# Patient Record
Sex: Female | Born: 1980 | Hispanic: No | Marital: Married | Smoking: Never smoker
Health system: Southern US, Community
[De-identification: ages and names within clinical notes are randomized; demographics above are authoritative.]

---

## 2005-11-24 ENCOUNTER — Ambulatory Visit: Payer: Self-pay | Admitting: Obstetrics & Gynecology

## 2006-02-12 ENCOUNTER — Inpatient Hospital Stay: Payer: Self-pay

## 2008-07-01 LAB — CONVERTED CEMR LAB: Pap Smear: NORMAL

## 2008-12-08 ENCOUNTER — Ambulatory Visit: Payer: Self-pay | Admitting: Family Medicine

## 2008-12-08 DIAGNOSIS — E785 Hyperlipidemia, unspecified: Secondary | ICD-10-CM

## 2008-12-08 DIAGNOSIS — R5381 Other malaise: Secondary | ICD-10-CM

## 2008-12-08 DIAGNOSIS — I781 Nevus, non-neoplastic: Secondary | ICD-10-CM

## 2008-12-08 DIAGNOSIS — D1800 Hemangioma unspecified site: Secondary | ICD-10-CM | POA: Insufficient documentation

## 2008-12-08 DIAGNOSIS — R5383 Other fatigue: Secondary | ICD-10-CM

## 2008-12-28 ENCOUNTER — Ambulatory Visit: Payer: Self-pay | Admitting: Family Medicine

## 2008-12-29 LAB — CONVERTED CEMR LAB
Albumin: 3.7 g/dL (ref 3.5–5.2)
Alkaline Phosphatase: 39 units/L (ref 39–117)
BUN: 14 mg/dL (ref 6–23)
CO2: 29 meq/L (ref 19–32)
Calcium: 9.1 mg/dL (ref 8.4–10.5)
Chloride: 106 meq/L (ref 96–112)
Cholesterol: 202 mg/dL — ABNORMAL HIGH (ref 0–200)
Eosinophils Relative: 2.2 % (ref 0.0–5.0)
GFR calc non Af Amer: 105.65 mL/min (ref >60)
Glucose, Bld: 88 mg/dL (ref 70–99)
HCT: 37 % (ref 36.0–46.0)
Hemoglobin: 12.6 g/dL (ref 12.0–15.0)
Lymphs Abs: 1.8 10*3/uL (ref 0.7–4.0)
Monocytes Relative: 6.4 % (ref 3.0–12.0)
Neutro Abs: 2.7 10*3/uL (ref 1.4–7.7)
Potassium: 3.9 meq/L (ref 3.5–5.1)
Total CHOL/HDL Ratio: 3
WBC: 4.9 10*3/uL (ref 4.5–10.5)

## 2009-01-08 ENCOUNTER — Ambulatory Visit: Payer: Self-pay | Admitting: Family Medicine

## 2009-07-02 ENCOUNTER — Encounter: Payer: Self-pay | Admitting: Family Medicine

## 2010-05-26 ENCOUNTER — Ambulatory Visit: Payer: Self-pay | Admitting: Family Medicine

## 2010-05-26 DIAGNOSIS — R519 Headache, unspecified: Secondary | ICD-10-CM | POA: Insufficient documentation

## 2010-05-26 DIAGNOSIS — R51 Headache: Secondary | ICD-10-CM

## 2010-05-26 DIAGNOSIS — M542 Cervicalgia: Secondary | ICD-10-CM

## 2010-10-25 NOTE — Assessment & Plan Note (Signed)
Summary: headache x 11 days/alc 11:30   Vital Signs:  Patient profile:   30 year old female Height:      66 inches Weight:      115.0 pounds BMI:     18.63 Temp:     99.0 degrees F oral Pulse rate:   72 / minute Pulse rhythm:   regular BP sitting:   100 / 60  (left arm) Cuff size:   regular  Vitals Entered By: Benny Lennert CMA Duncan Dull) (May 26, 2010 11:33 AM)  History of Present Illness: Chief complaint headache for 61 days  30 year old female:  dull back, raiating to the front.   has been to the chiropractor. on the twenty fifth. not completely gone.   having a lot of muscle spasm on the right.      Headache HPI:      The patient comes in for an acute visit for headaches.  The current headache started approximately 05/15/2010.  The headaches will last anywhere from 2 hours to 3 days at a time.  She has approximately 1 headaches per month.        The location of the headaches are occipital.  Aggravating factors include head movement and bending down.  Precipitating factors consist of stress.  The headaches are associated with photophobia.        Positive alarm features include change in frequency from prior H/A's and change in features from prior H/A's.  The patient denies first or worst H/A of life, mylagia, fever, malaise, weight loss, scalp tenderness, jaw claudication, focal neurologic symptoms, confusion, seizures, and impaired level of consciousness.         Headache Treatment History:      NSAID's were tried but not effective.  She has tried acetaminophen-plain and ibuprofen which were ineffective.    Allergies: 1)  ! Codeine Sulfate (Codeine Sulfate)  Past History:  Past medical, surgical, family and social histories (including risk factors) reviewed, and no changes noted (except as noted below).  Past Medical History: Reviewed history from 12/08/2008 and no changes required. Hyperlipidemia  DERM = Diona Browner OB = [place]  Past Surgical  History: Reviewed history from 12/08/2008 and no changes required. none  Family History: Reviewed history from 12/08/2008 and no changes required. M: meningioma, primary biliary cirrhosis PGF, gout, leukemia Aunt: Systemic Lupus MGM: CAD, COPD, RA Sister: JRA  Very strong autoimmune history  Social History: Reviewed history from 12/08/2008 and no changes required. Marital Status: Married Children: 52 (55 year old boy) Occupation: Airline pilot, [employer], [employer], [employer] Never Smoked Alcohol use-no Drug use-no  Review of Systems       REVIEW OF SYSTEMS  GEN: No systemic complaints, no fevers, chills, sweats, or other acute illnesses MSK: Detailed in the HPI GI: tolerating PO intake without difficulty Neuro: No numbness, parasthesias, or tingling associated. Otherwise the pertinent positives of the ROS are noted above.    Physical Exam  General:  GEN: Well-developed,well-nourished,in no acute distress; alert,appropriate and cooperative throughout examination HEENT: Normocephalic and atraumatic without obvious abnormalities. No apparent alopecia or balding. Ears, externally no deformities PULM: Breathing comfortably in no respiratory distress EXT: No clubbing, cyanosis, or edema PSYCH: Normally interactive. Cooperative during the interview. Pleasant. Friendly and conversant. Not anxious or depressed appearing. Normal, full affect.  Msk:  ttp post paracervical musc  Headache Neurologic Exam:    Cranial Nerves Intact:  Yes    Focal Neurologic Deficit:  No    Motor Exam/Strength-Left:  Left Biceps Flexion ( ):  normal       Left Triceps Extension ( ):  normal       Left Hand Grasp ( ):  normal       Left Deltoid ( ):  normal       Left Hip Flexors ( ):  normal       Left Ankle Dorsiflexion (L5,L4):  normal       Left Great Toe Dorsiflexion (L5,L4):  normal       Left Heel Walk (L5,some L4):  normal       Left Single Squat & Rise-Quads (L4):  normal       Left Toe  Walk-calf (S1):  normal    Motor Exam/Strength-Right:       Right Biceps Flexion ( ):  normal       Right Triceps Extension ( ):  normal       Right Hand Grasp ( ):  normal       Right Deltoid ( ):  normal       Right Hip Flexors ( ):  normal       Right Ankle Dorsiflexion (L5,L4):  normal       Right Great Toe Dorsiflexion (L5,L4):  normal       Right Heel Walk (L5,some L4):  normal       Right Single Squat & Rise-Quads (L4):  normal       Right Toe Walk-calf (S1):  normal    Sensory Exam/Pinprick-Left:       Left Face:  normal       Left Upper Extremity:  normal       Left Lower Extremity:  normal       Left Medial Foot (L4):  normal       Left Dorsal Foot (L5):  normal       Left Lateral Foot (S1):  normal    Sensory Exam/Pinprick-Right:       Right Face:  normal       Right Upper Extremity:  normal       Right Lower Extremity:  normal       Right Medial Foot (L4):  normal       Right Dorsal Foot (L5):  normal       Right Lateral Foot (S1):  normal    Reflexes-Left:       Left Biceps ( ):  normal       Left Triceps ( ):  normal       Left Brachioradialis ( ):  normal       Left Knee Jerk (L4):  normal       Left Ankle Reflex (S1):  normal    Reflexes-Right:       Right Biceps ( ):  normal       Right Triceps ( ):  normal       Right Brachioradialis ( ):  normal       Right Knee Jerk (L4):  normal       Right Ankle Reflex (S1):  normal    Cerebellar:  normal    Gait:  normal    Speech:  normal   Impression & Recommendations:  Problem # 1:  HEADACHE (ICD-784.0) Assessment New I think much associated with neck start mckenzie protocol, manipulation with Dr. Anner Crete, moist heat, NSAIDS, zanaflex at night. fiorecet if bad  Her updated medication list for this problem includes:    Butalbital-apap-caffeine 50-325-40 Mg Tabs (  Butalbital-apap-caffeine) .Marland Kitchen... 1 - 2 tabs by mouth q 4 hours as needed severe headache. (max 6/24 hours)  Problem # 2:  CERVICALGIA  (ICD-723.1) Assessment: New  Her updated medication list for this problem includes:    Tizanidine Hcl 4 Mg Tabs (Tizanidine hcl) .Marland Kitchen... 1 by mouth at bedtime as needed neck spasms    Butalbital-apap-caffeine 50-325-40 Mg Tabs (Butalbital-apap-caffeine) .Marland Kitchen... 1 - 2 tabs by mouth q 4 hours as needed severe headache. (max 6/24 hours)  Complete Medication List: 1)  Tizanidine Hcl 4 Mg Tabs (Tizanidine hcl) .Marland Kitchen.. 1 by mouth at bedtime as needed neck spasms 2)  Butalbital-apap-caffeine 50-325-40 Mg Tabs (Butalbital-apap-caffeine) .Marland Kitchen.. 1 - 2 tabs by mouth q 4 hours as needed severe headache. (max 6/24 hours)  Headache Assessment/Plan:    Headache Classification:  Tension headache  Patient Instructions: 1)  YOUTUBE: MCKENZIE PROTOCOL NECK EXERCISES Prescriptions: BUTALBITAL-APAP-CAFFEINE 50-325-40 MG TABS (BUTALBITAL-APAP-CAFFEINE) 1 - 2 tabs by mouth q 4 hours as needed severe headache. (max 6/24 hours)  #30 x 0   Entered and Authorized by:   Hannah Beat MD   Signed by:   Hannah Beat MD on 05/26/2010   Method used:   Print then Give to Patient   RxID:   [id number] TIZANIDINE HCL 4 MG TABS (TIZANIDINE HCL) 1 by mouth at bedtime as needed neck spasms  #30 x 1   Entered and Authorized by:   Hannah Beat MD   Signed by:   Hannah Beat MD on 05/26/2010   Method used:   Print then Give to Patient   RxID:   [id number]   Prior Medications (reviewed today): None Current Allergies (reviewed today): ! CODEINE SULFATE (CODEINE SULFATE)

## 2011-03-26 ENCOUNTER — Inpatient Hospital Stay: Payer: Self-pay

## 2012-01-22 ENCOUNTER — Ambulatory Visit: Payer: Self-pay

## 2019-11-18 ENCOUNTER — Encounter: Payer: Self-pay | Admitting: Certified Nurse Midwife

## 2019-12-08 ENCOUNTER — Encounter: Payer: Self-pay | Admitting: Certified Nurse Midwife

## 2020-08-16 ENCOUNTER — Ambulatory Visit: Payer: Self-pay | Admitting: Obstetrics

## 2020-08-23 ENCOUNTER — Ambulatory Visit: Payer: Self-pay | Admitting: Obstetrics

## 2020-08-25 ENCOUNTER — Ambulatory Visit (INDEPENDENT_AMBULATORY_CARE_PROVIDER_SITE_OTHER): Payer: 59 | Admitting: Obstetrics

## 2020-08-25 ENCOUNTER — Other Ambulatory Visit (HOSPITAL_COMMUNITY)
Admission: RE | Admit: 2020-08-25 | Discharge: 2020-08-25 | Disposition: A | Discharge status: Home / Self Care | Payer: 59 | Source: Ambulatory Visit | Attending: Obstetrics | Admitting: Obstetrics

## 2020-08-25 ENCOUNTER — Other Ambulatory Visit: Payer: Self-pay

## 2020-08-25 ENCOUNTER — Encounter: Payer: Self-pay | Admitting: Obstetrics

## 2020-08-25 VITALS — BP 124/74 | Ht 66.5 in | Wt 125.0 lb

## 2020-08-25 DIAGNOSIS — Z124 Encounter for screening for malignant neoplasm of cervix: Secondary | ICD-10-CM | POA: Diagnosis not present

## 2020-08-25 DIAGNOSIS — Z1231 Encounter for screening mammogram for malignant neoplasm of breast: Secondary | ICD-10-CM | POA: Diagnosis not present

## 2020-08-25 DIAGNOSIS — Z01419 Encounter for gynecological examination (general) (routine) without abnormal findings: Secondary | ICD-10-CM | POA: Insufficient documentation

## 2020-08-25 NOTE — Progress Notes (Addendum)
Gynecology Annual Exam  PCP: Owens Loffler, MD  Chief Complaint:  Chief Complaint  Patient presents with  . Annual Exam    New Patient Annual, over 3 years.     History of Present Illness Ms. Jessica Solis is a 39 y.o. [phone] who LMP was Patient's last menstrual period was 08/04/2020., presents today for her annual examination.  Her menses are present due to age and her use of a Paragard IUD, hysterectomy, menopause, surgical menopause and hormonal contraceptive.  She does not have vasomotor sx. She uses no regular meds. She does take daily Vitamin D, and Blessed Thistle and magnesium  She is single partner, contraception - IUD. She does not have vaginal dryness.  Last Pap: 2019 per her report  Results were: NILM /neg HPV DNA.  Hx of STDs: none  Last mammogram: Has not had a baseline due to age.   There is no FH of breast cancer. There is no FH of ovarian cancer. The patient does do self-breast exams.    Tobacco use: The patient denies current or previous tobacco use. Alcohol use: social drinker Exercise: moderately active  She does get adequate calcium and Vitamin D in her diet. She also takes vitamins daily.  The patient is sexually active. She currently uses IUD: Present: yes for contraception. The patient wears seatbelts: yes.   last pap: was normal and she shares that many years ago she had abnormal paps but non in the last 5-10 years.  The patient has regular exercise: yes.  The patient has ever been transfused or tattooed?: no.  The patient reports that domestic violence in her life is absent.    Review of Systems: Review of Systems  Constitutional: Negative.   HENT: Negative.   Eyes: Negative.   Respiratory: Negative.   Cardiovascular: Negative.   Gastrointestinal: Negative.   Genitourinary: Negative.   Musculoskeletal: Negative.   Skin: Negative.   Neurological: Negative.   Endo/Heme/Allergies: Negative.   Psychiatric/Behavioral: Negative.      Past  Medical History:  History reviewed. No pertinent past medical history.  Past Surgical History:  Past Surgical History:  Procedure Laterality Date  . CESAREAN SECTION      Medications: Prior to Admission medications   Medication Sig Start Date End Date Taking? Authorizing Provider  PARAGARD INTRAUTERINE COPPER IU by Intrauterine route.   Yes [provider]    Allergies:  Allergies  Allergen Reactions  . Codeine Sulfate     REACTION: itching    Gynecologic History: Patient's last menstrual period was 08/04/2020.  Obstetric History: [id number]  Social History:  Social History   Socioeconomic History  . Marital status: Married    Spouse name: Not on file  . Number of children: Not on file  . Years of education: Not on file  . Highest education level: Not on file  Occupational History  . Not on file  Tobacco Use  . Smoking status: Never Smoker  . Smokeless tobacco: Never Used  Substance and Sexual Activity  . Alcohol use: Yes  . Drug use: Never  . Sexual activity: Yes    Birth control/protection: I.U.D.  Other Topics Concern  . Not on file  Social History Narrative  . Not on file   Social Determinants of Health   Financial Resource Strain:   . Difficulty of Paying Living Expenses: Not on file  Food Insecurity:   . Worried About Charity fundraiser in the Last Year: Not on file  . Ran Out  of Food in the Last Year: Not on file  Transportation Needs:   . Lack of Transportation (Medical): Not on file  . Lack of Transportation (Non-Medical): Not on file  Physical Activity:   . Days of Exercise per Week: Not on file  . Minutes of Exercise per Session: Not on file  Stress:   . Feeling of Stress : Not on file  Social Connections:   . Frequency of Communication with Friends and Family: Not on file  . Frequency of Social Gatherings with Friends and Family: Not on file  . Attends Religious Services: Not on file  . Active Member of Clubs or Organizations:  Not on file  . Attends Archivist Meetings: Not on file  . Marital Status: Not on file  Intimate Partner Violence:   . Fear of Current or Ex-Partner: Not on file  . Emotionally Abused: Not on file  . Physically Abused: Not on file  . Sexually Abused: Not on file    Family History:  Family History  Problem Relation Age of Onset  . Autoimmune disease Sister   . Leukemia Paternal Grandfather      Physical Exam Vitals:  Vitals:   08/25/20 0825  BP: 124/74   Physical Exam Vitals reviewed.  Constitutional:      Appearance: Normal appearance. She is normal weight.  HENT:     Head: Atraumatic.     Nose: Nose normal.  Eyes:     Extraocular Movements: Extraocular movements intact.     Pupils: Pupils are equal, round, and reactive to light.  Cardiovascular:     Rate and Rhythm: Normal rate and regular rhythm.     Pulses: Normal pulses.     Heart sounds: Normal heart sounds.  Pulmonary:     Effort: Pulmonary effort is normal.     Breath sounds: Normal breath sounds.  Abdominal:     General: Abdomen is flat. Bowel sounds are normal.     Palpations: Abdomen is soft.  Genitourinary:    General: Normal vulva.     Comments: Normal female anatomy. No rashes, lesions or vaginal discharge noted. speculum exam: normal rugae, pink mucosa. Cervix is without CMT. Uterus is anteverted, mobile, midline and non enlarged. No IUD strings visible today Musculoskeletal:        General: Normal range of motion.     Cervical back: Normal range of motion and neck supple.  Skin:    General: Skin is warm and dry.  Neurological:     General: No focal deficit present.     Mental Status: She is alert and oriented to person, place, and time.  Psychiatric:        Mood and Affect: Mood normal.        Behavior: Behavior normal.      Assessment: 39 y.o. [id number] well woman exam  Plan:  1) Contraception Education given regarding options for contraception, including IUD placement. Her  Paragrd will be due for removal and replacement  In 2023  2) STI screening was offered declined  3) Pap done  4) Routine healthcare maintenance including cholesterol, diabetes screening is done by her PCP  5) Follow up 1 year for routine annual exam  1. Women's annual routine gynecological examination  - Cytology - PAP - MM 3D SCREEN BREAST BILATERAL; Future ordered  2. Cervical cancer screening performed - Cytology - PAP  3. Breast cancer screening by mammogram Ordered- baseline screening. - MM 3D SCREEN BREAST BILATERAL; Future  Jessica Solis, CNM  08/25/2020 11:48 AM

## 2020-08-27 ENCOUNTER — Encounter: Payer: Self-pay | Admitting: Obstetrics

## 2020-08-27 LAB — CYTOLOGY - PAP
Comment: NEGATIVE
Diagnosis: NEGATIVE
High risk HPV: NEGATIVE

## 2020-09-21 ENCOUNTER — Ambulatory Visit
Admission: RE | Admit: 2020-09-21 | Discharge: 2020-09-21 | Disposition: A | Discharge status: Home / Self Care | Payer: 59 | Source: Ambulatory Visit | Attending: Obstetrics | Admitting: Obstetrics

## 2020-09-21 ENCOUNTER — Other Ambulatory Visit: Payer: Self-pay

## 2020-09-21 DIAGNOSIS — Z1231 Encounter for screening mammogram for malignant neoplasm of breast: Secondary | ICD-10-CM | POA: Insufficient documentation

## 2020-09-21 DIAGNOSIS — Z01419 Encounter for gynecological examination (general) (routine) without abnormal findings: Secondary | ICD-10-CM | POA: Insufficient documentation

## 2020-09-22 ENCOUNTER — Encounter: Payer: Self-pay | Admitting: Obstetrics

## 2020-09-22 ENCOUNTER — Other Ambulatory Visit: Payer: Self-pay | Admitting: Obstetrics

## 2020-09-22 DIAGNOSIS — N6489 Other specified disorders of breast: Secondary | ICD-10-CM

## 2020-09-22 DIAGNOSIS — R928 Other abnormal and inconclusive findings on diagnostic imaging of breast: Secondary | ICD-10-CM

## 2020-10-01 ENCOUNTER — Ambulatory Visit
Admission: RE | Admit: 2020-10-01 | Discharge: 2020-10-01 | Disposition: A | Discharge status: Home / Self Care | Payer: 59 | Source: Ambulatory Visit | Attending: Obstetrics | Admitting: Obstetrics

## 2020-10-01 ENCOUNTER — Other Ambulatory Visit: Payer: Self-pay

## 2020-10-01 DIAGNOSIS — R928 Other abnormal and inconclusive findings on diagnostic imaging of breast: Secondary | ICD-10-CM | POA: Diagnosis not present

## 2020-10-01 DIAGNOSIS — N6489 Other specified disorders of breast: Secondary | ICD-10-CM | POA: Diagnosis present

## 2021-03-07 ENCOUNTER — Telehealth: Payer: 59 | Admitting: Obstetrics & Gynecology

## 2021-03-07 NOTE — Telephone Encounter (Signed)
Patient coming in on 09/05/21 at 8:00 with [facility] for Paraguard removal and reinsertion.

## 2021-03-08 NOTE — Telephone Encounter (Signed)
Noted. Will order to arrive by apt date/time. 

## 2021-06-23 IMAGING — MG DIGITAL SCREENING BILAT W/ TOMO W/ CAD
8 series · 9 of 24 positions shown · non-contrast
Comparison: None.

CLINICAL DATA: Screening.

EXAM:
DIGITAL SCREENING BILATERAL MAMMOGRAM WITH TOMO AND CAD

[R CC synth-2D]
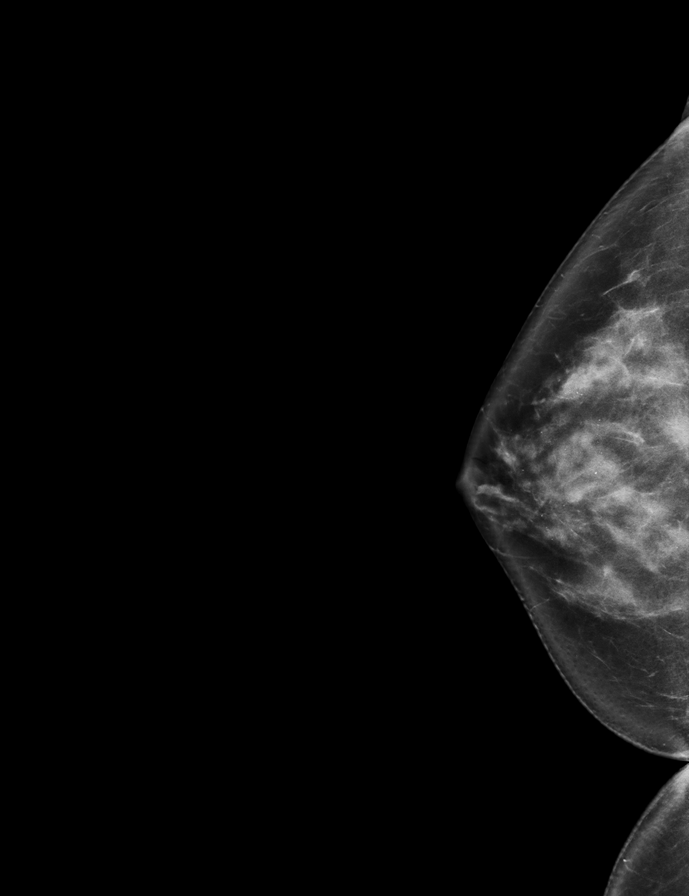

[L MLO synth-2D]
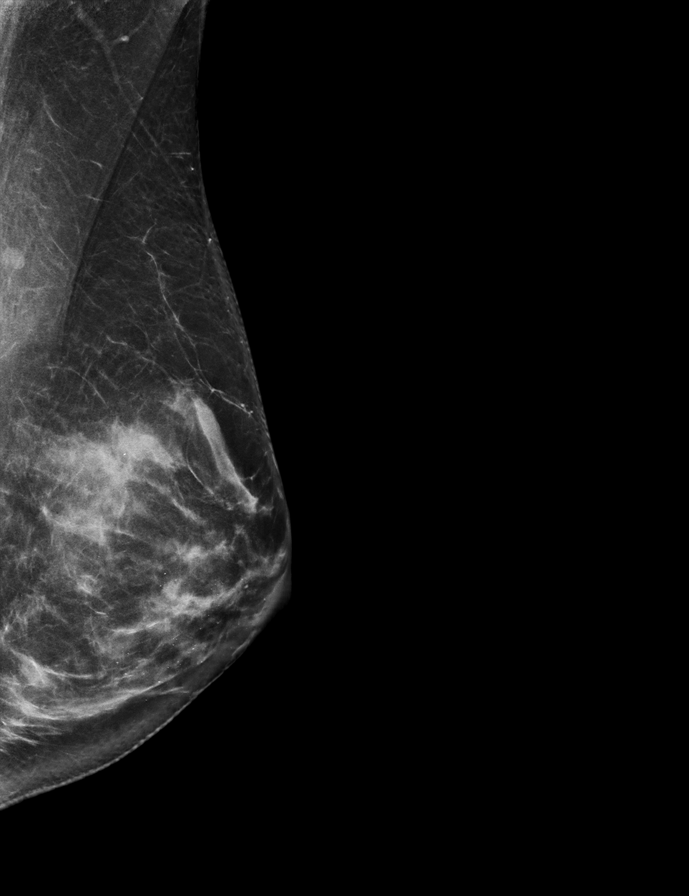

[R MLO synth-2D]
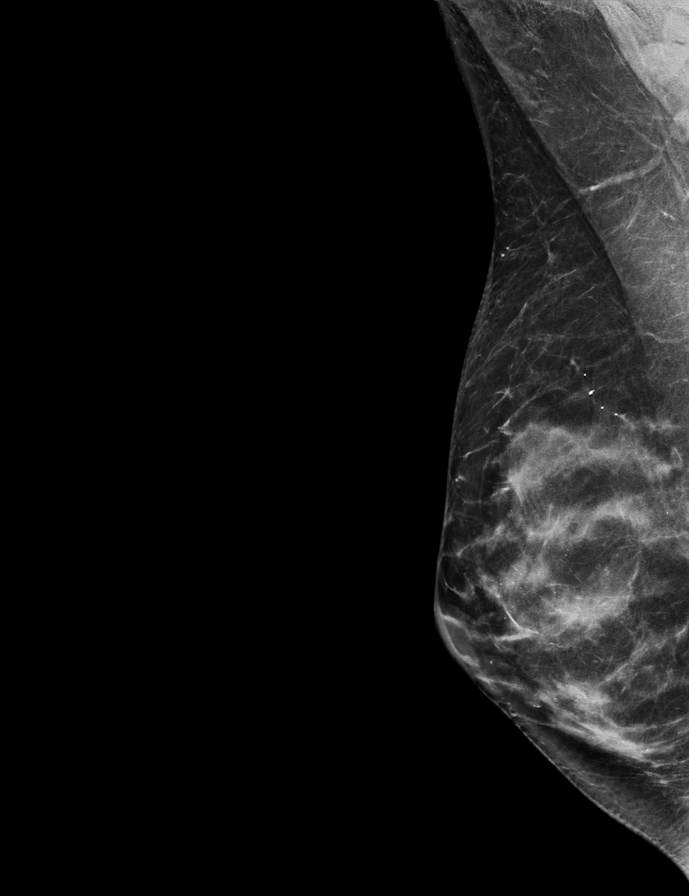

[L CC synth-2D]
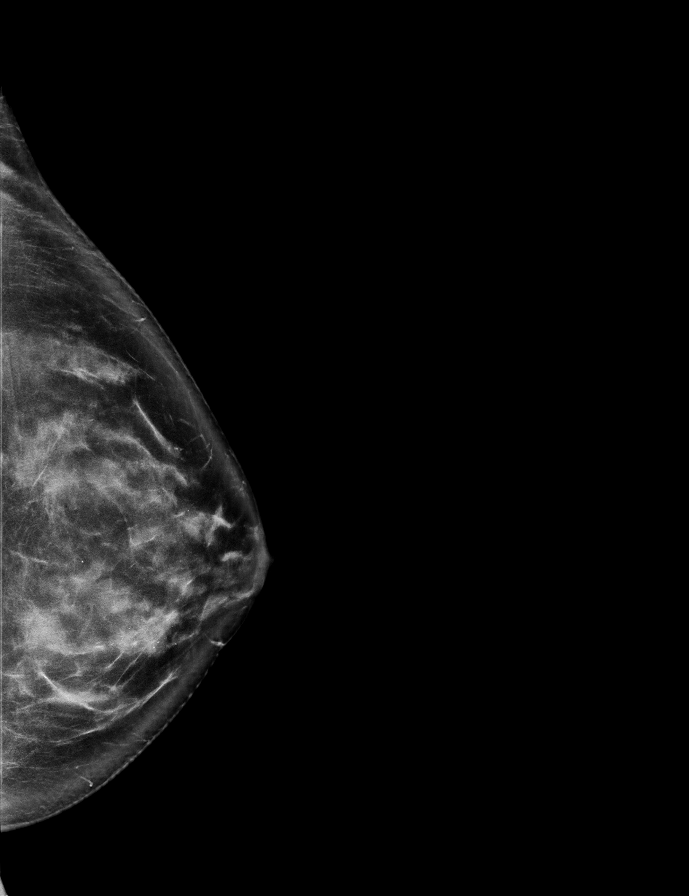

[L CC tomo · 2 of 73 frames shown]
[frame 24/73]
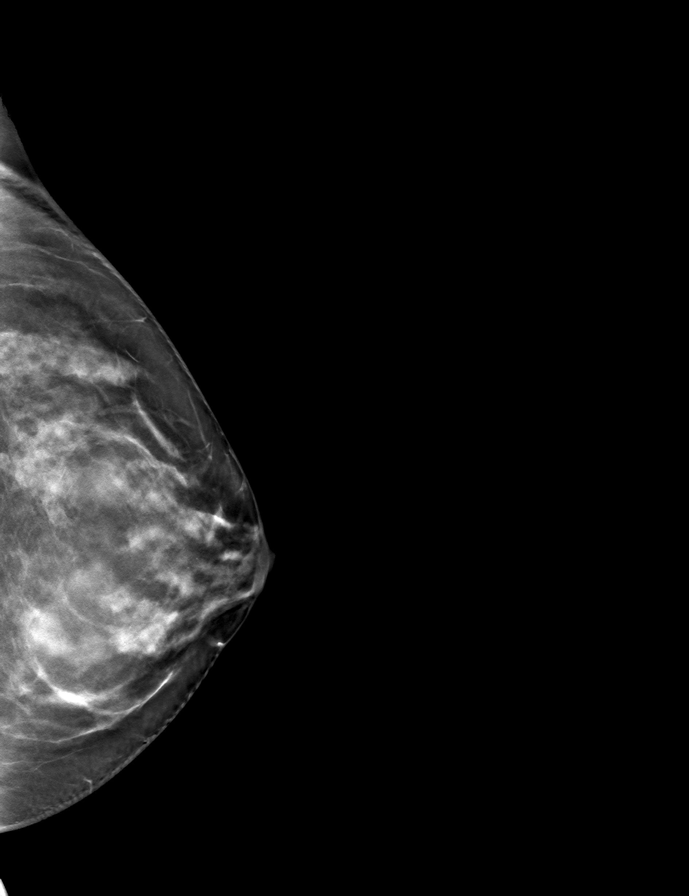
[frame 37/73]
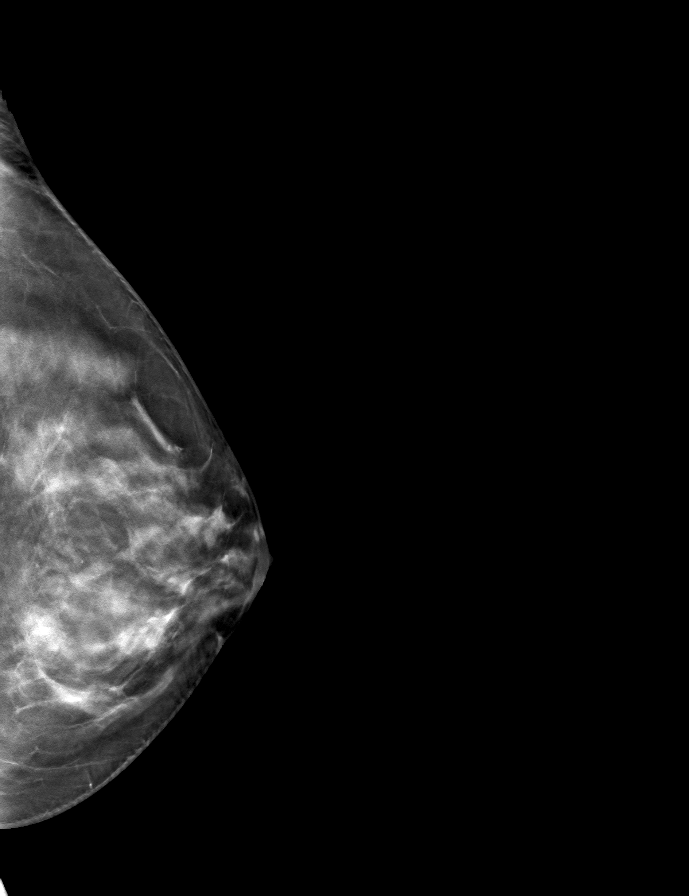

[R CC tomo · tomo slice 37/73.0]
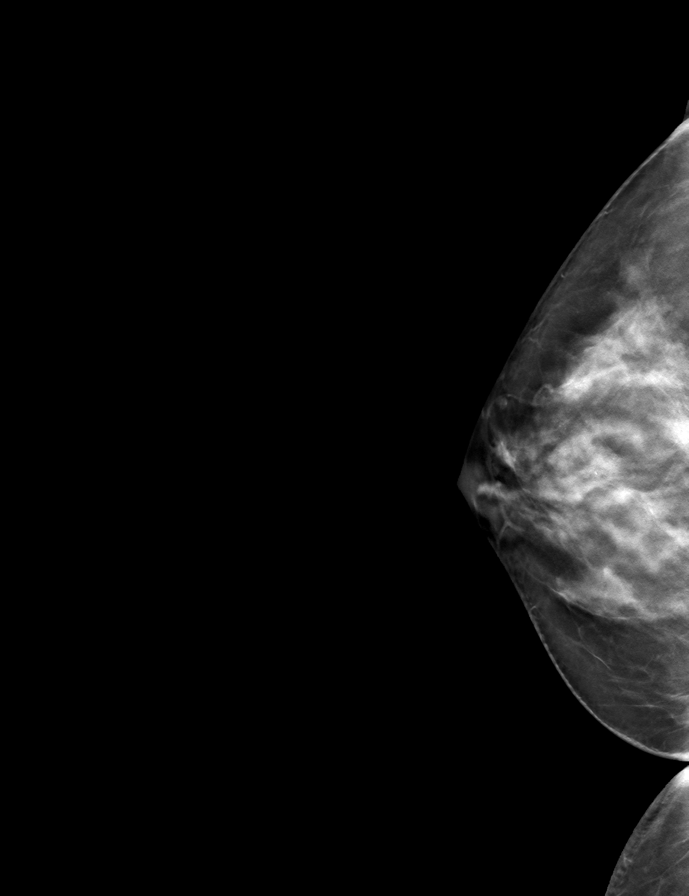

[R MLO tomo · tomo slice 31/61.0]
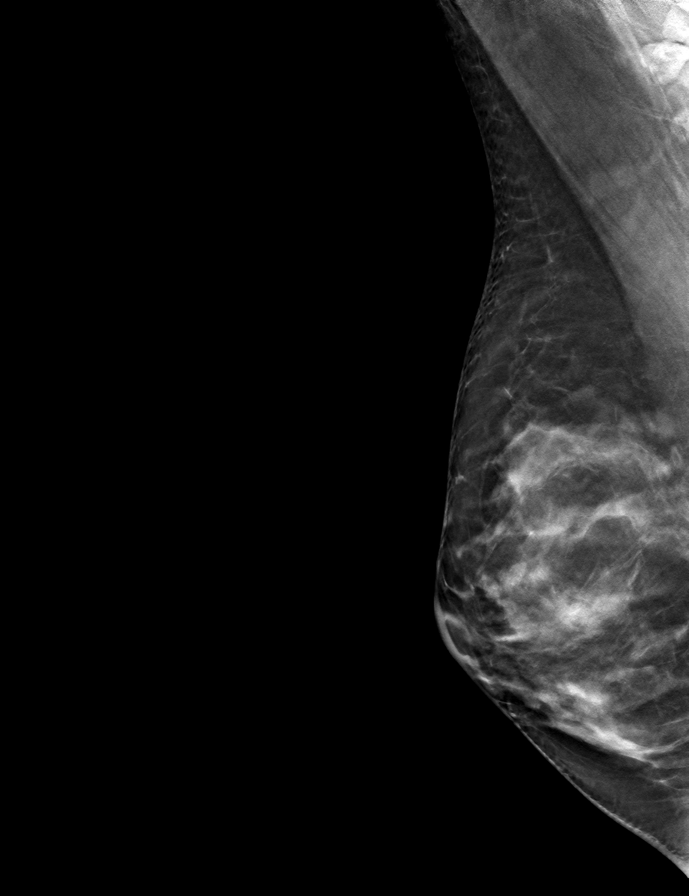

[L MLO tomo · tomo slice 35/68.0]
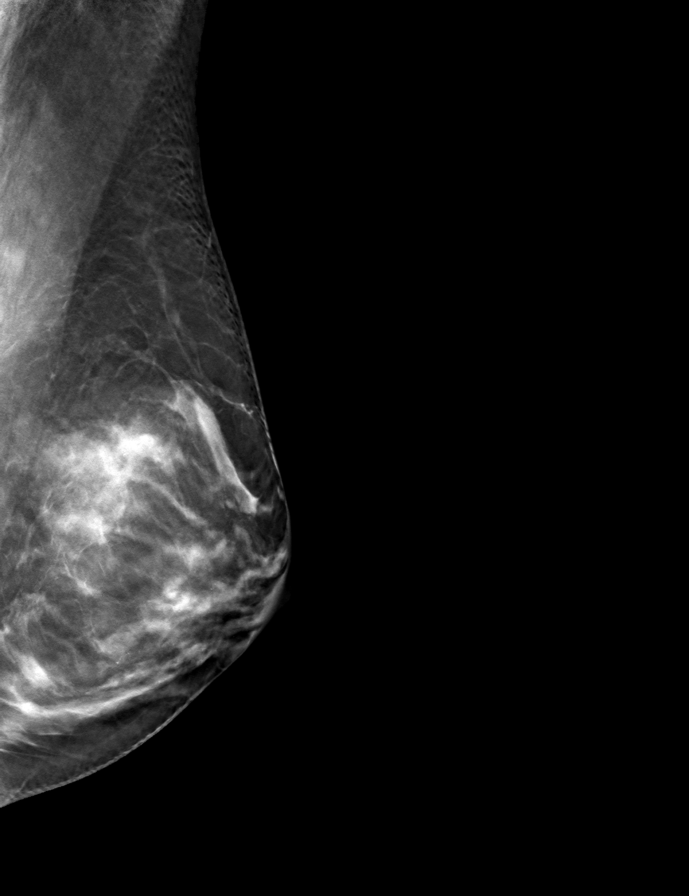

[9 of 24 positions shown; findings below may reference images not displayed]

ACR Breast Density Category c: The breast tissue is heterogeneously
dense, which may obscure small masses.
FINDINGS: In the left breast, asymmetries warrant further evaluation. In the
right breast, no findings suspicious for malignancy. Images were
processed with CAD.
IMPRESSION: Further evaluation is suggested for possible asymmetries in the left
breast.

RECOMMENDATION:
Diagnostic mammogram and possibly ultrasound of the left breast.
(Code:[id number])

The patient will be contacted regarding the findings, and additional
imaging will be scheduled.

BI-RADS CATEGORY  0: Incomplete. Need additional imaging evaluation
and/or prior mammograms for comparison.

## 2021-08-09 NOTE — Telephone Encounter (Signed)
Patient had to be rescheduled due to schedule change. Patient is scheduled for 09/16/21 with [facility]

## 2021-08-30 NOTE — Telephone Encounter (Signed)
Noted. Will order to arrive by apt date/time. 

## 2021-09-05 ENCOUNTER — Ambulatory Visit: Payer: Self-pay | Admitting: Obstetrics & Gynecology

## 2021-09-07 ENCOUNTER — Telehealth: Payer: Self-pay | Admitting: Obstetrics & Gynecology

## 2021-09-07 NOTE — Telephone Encounter (Signed)
Pt has rescheduled appt and will be coming in for Paraguard removal and reinsertion on 1/11 with [facility].

## 2021-09-08 NOTE — Telephone Encounter (Signed)
Noted. Will order to arrive by apt date/time. 

## 2021-09-16 ENCOUNTER — Ambulatory Visit: Payer: Self-pay | Admitting: Obstetrics & Gynecology

## 2021-10-03 ENCOUNTER — Ambulatory Visit: Payer: Self-pay | Admitting: Obstetrics

## 2021-10-05 ENCOUNTER — Ambulatory Visit (INDEPENDENT_AMBULATORY_CARE_PROVIDER_SITE_OTHER): Payer: 59 | Admitting: Obstetrics & Gynecology

## 2021-10-05 ENCOUNTER — Encounter: Payer: Self-pay | Admitting: Obstetrics & Gynecology

## 2021-10-05 ENCOUNTER — Other Ambulatory Visit: Payer: Self-pay

## 2021-10-05 VITALS — BP 102/70 | Ht 67.0 in | Wt 126.0 lb

## 2021-10-05 DIAGNOSIS — Z30433 Encounter for removal and reinsertion of intrauterine contraceptive device: Secondary | ICD-10-CM

## 2021-10-05 NOTE — Progress Notes (Signed)
°  History of Present Illness:  Jessica Solis is a 41 y.o. that had a Paragard IUD placed approximately 10 years ago. Since that time, she states that her period are regular and she prefers this form of contraception.  The following portions of the patient's history were reviewed and updated as appropriate: allergies, current medications, past family history, past medical history, past social history, past surgical history, and problem list.  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     No current facility-administered medications on file prior to visit.   Allergies: is allergic to codeine sulfate.  Physical Exam:  BP 102/70    Ht 5\' 7"  (1.702 m)    Wt 126 lb (57.2 kg)    LMP 09/13/2021    BMI 19.73 kg/m  Body mass index is 19.73 kg/m. Constitutional: Well nourished, well developed female in no acute distress.  Abdomen: diffusely non tender to palpation, non distended, and no masses, hernias Neuro: Grossly intact Psych:  Normal mood and affect.    Pelvic exam:  Two IUD strings absent or not seen coming from the cervical os, but able to be retrieved with a [place] forceps. EGBUS, vaginal vault and cervix: within normal limits  IUD Removal Strings of IUD identified and grasped.  IUD removed without problem.  Pt tolerated this well.  IUD noted to be intact.  Assessment: IUD Removal  Plan: IUD removed and plan for contraception is  a new Paraguard IUD, see below .  She was amenable to this plan.   IUD PROCEDURE NOTE:  Jessica Solis is a 41 y.o. [phone] here for IUD insertion. No GYN concerns.  Last pap smear was normal.  IUD Insertion Procedure  Note Patient identified, informed consent performed, consent signed.   Discussed risks of irregular bleeding, cramping, infection, malpositioning or misplacement of the IUD outside the uterus which may require further procedure such as laparoscopy, risk of failure <1%. Time out was performed.    A bimanual exam showed the uterus to be midposition.  Speculum placed in the vagina.  Cervix visualized.  Cleaned with Betadine x 2.  Grasped anteriorly with a single tooth tenaculum.  Uterus sounded to 7 cm.   Paragard IUD placed per manufacturer's recommendations.  Strings trimmed to 3 cm. Tenaculum was removed, good hemostasis noted.  Patient tolerated procedure well.   Patient was given post-procedure instructions.  She was advised to have backup contraception for one week.  Patient was also asked to check IUD strings periodically and follow up in 4 weeks for IUD check.  Barnett Applebaum, MD, [facility], [facility] 10/05/2021  3:07 PM

## 2021-10-05 NOTE — Patient Instructions (Signed)
Intrauterine Device Insertion, Care After This sheet gives you information about how to care for yourself after your procedure. Your health care provider may also give you more specific instructions. If you have problems or questions, contact your health care provider. What can I expect after the procedure? After the procedure, it is common to have: Cramps and pain in the abdomen. Bleeding. It may be light or heavy. This may last for a few days. Lower back pain. Dizziness. Headaches. Nausea. Follow these instructions at home:  Before resuming sexual activity, check to make sure that you can feel the IUD string or strings. You should be able to feel the end of the string below the opening of your cervix. If your IUD string is in place, you may resume sexual activity. If you had a hormonal IUD inserted more than 7 days after your most recent period started, you will need to use a backup method of birth control for 7 days after IUD insertion. Ask your health care provider whether this applies to you. Continue to check that the IUD is still in place by feeling for the strings after every menstrual period, or once a month. An IUD will not protect you from sexually transmitted infections (STIs). Use methods to prevent the exchange of body fluids between partners (barrier protection) every time you have sex. Barrier protection can be used during oral, vaginal, or anal sex. Commonly used barrier methods include: Female condom. Female condom. Dental dam. Take over-the-counter and prescription medicines only as told by your health care provider. Keep all follow-up visits as told by your health care provider. This is important. Contact a health care provider if: You feel light-headed or weak. You have any of the following problems with your IUD string or strings: The string bothers or hurts you or your sexual partner. You cannot feel the string. The string has gotten longer. You can feel the IUD in  your vagina. You think you may be pregnant, or you miss your menstrual period. You think you may have a sexually transmitted infection (STI). Get help right away if: You have flu-like symptoms, such as tiredness (fatigue) and muscle aches. You have a fever and chills. You have bleeding that is heavier or lasts longer than a normal menstrual cycle. You have abnormal or bad-smelling discharge from your vagina. You develop abdominal pain that is new, is getting worse, or is not in the same area of earlier cramping and pain. You have pain during sexual activity. Summary After the procedure, it is common to have cramps and pain in the abdomen. It is also common to have light bleeding or heavier bleeding that is like your menstrual period. Continue to check that the IUD is still in place by feeling for the strings after every menstrual period, or once a month. Keep all follow-up visits as told by your health care provider. This is important. Contact your health care provider if you have problems with your IUD strings, such as the string getting longer or bothering you or your sexual partner. This information is not intended to replace advice given to you by your health care provider. Make sure you discuss any questions you have with your health care provider. Document Revised: 09/02/2019 Document Reviewed: 09/02/2019 Elsevier Patient Education  2022 Elsevier Inc.  

## 2021-10-14 NOTE — Telephone Encounter (Signed)
Paragard rcvd/charged 10/05/2021

## 2021-11-04 ENCOUNTER — Ambulatory Visit: Payer: Self-pay | Admitting: Obstetrics & Gynecology

## 2023-07-27 ENCOUNTER — Telehealth: Payer: Self-pay

## 2023-07-27 NOTE — Telephone Encounter (Signed)
TRIAGE VOICEMAIL: Patient has Paragard. Periods have always been on schedule. LMP 10/20 - 10/25-26. She started cramping/spotting yesterday which is uncommon for her. Inquiring how long she should let this go on before being seen.

## 2023-07-27 NOTE — Telephone Encounter (Signed)
Spoke with patient. Inquired if she has been under extra stress/had life changes. She states her job is stressful, but nothing out of the norm. She advised she had cramping on Wed, none yesterday, but has had cramping today. She has used one super tampon today that she placed at 6 am and changed around 11 that was not completely full. She then placed a regular tampon and has not had to change it yet. Advised of heavy bleeding protocol and when to go to ER. Patient request appointment next week to evaluate if it doesn't subside over weekend. Transferred to front desk for scheduling.

## 2023-07-30 ENCOUNTER — Ambulatory Visit: Payer: 59 | Admitting: Certified Nurse Midwife

## 2024-03-18 ENCOUNTER — Ambulatory Visit (INDEPENDENT_AMBULATORY_CARE_PROVIDER_SITE_OTHER)

## 2024-03-18 ENCOUNTER — Ambulatory Visit: Admitting: Podiatry

## 2024-03-18 ENCOUNTER — Encounter: Payer: Self-pay | Admitting: Podiatry

## 2024-03-18 VITALS — Ht 67.0 in | Wt 126.0 lb

## 2024-03-18 DIAGNOSIS — M779 Enthesopathy, unspecified: Secondary | ICD-10-CM

## 2024-03-18 DIAGNOSIS — G5761 Lesion of plantar nerve, right lower limb: Secondary | ICD-10-CM

## 2024-03-20 NOTE — Progress Notes (Signed)
   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    HPI: 43 y.o. female presenting today as a new patient for evaluation of pain and tenderness associated to the right forefoot.  This has been ongoing for several months.  Idiopathic onset.  She believes that she possibly has a Morton's neuroma to this area.  She notices tingling sensation especially when she wears tight fitting shoes.  No past medical history on file.  Past Surgical History:  Procedure Laterality Date   CESAREAN SECTION      Allergies  Allergen Reactions   Codeine Sulfate     REACTION: itching     Physical Exam: General: The patient is alert and oriented x3 in no acute distress.  Dermatology: Skin is warm, dry and supple bilateral lower extremities.   Vascular: Palpable pedal pulses bilaterally. Capillary refill within normal limits.  No appreciable edema.  No erythema.  Neurological: Grossly intact via light touch  Musculoskeletal Exam: There is tenderness with palpation to the third intermetatarsal space of the right forefoot.  Pain with lateral compression of the metatarsal heads as well  Radiographic Exam RT foot 03/18/2024:  Normal osseous mineralization. Joint spaces preserved.  No fractures or osseous irregularities noted.  Impression: Negative  Assessment/Plan of Care: 1.  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  X-rays reviewed -Today we discussed conservative management and treatment for the patient including appropriate shoe gear that supports the medial longitudinal arch of the foot and offload pressure from the forefoot.  Also discussed wide fitting  shoes that do not constrict the toebox area -Recommend OTC Motrin PRN -Return to clinic as needed       Thresa EMERSON Sar, DPM [facility]  Dr. Thresa EMERSON Sar, DPM    [address] [place], [place] [address]                Office [phone]  Fax [phone]
